# Patient Record
Sex: Female | Born: 1940 | Race: White | Marital: Married | Smoking: Never smoker
Health system: Southern US, Community
[De-identification: ages and names within clinical notes are randomized; demographics above are authoritative.]

## PROBLEM LIST (undated history)

## (undated) DIAGNOSIS — I447 Left bundle-branch block, unspecified: Secondary | ICD-10-CM

## (undated) DIAGNOSIS — I10 Essential (primary) hypertension: Secondary | ICD-10-CM

## (undated) DIAGNOSIS — J329 Chronic sinusitis, unspecified: Secondary | ICD-10-CM

## (undated) DIAGNOSIS — I493 Ventricular premature depolarization: Secondary | ICD-10-CM

## (undated) DIAGNOSIS — R21 Rash and other nonspecific skin eruption: Secondary | ICD-10-CM

## (undated) DIAGNOSIS — E7800 Pure hypercholesterolemia, unspecified: Secondary | ICD-10-CM

## (undated) HISTORY — PX: CHOLECYSTECTOMY: SHX55

---

## 2012-01-30 ENCOUNTER — Encounter (HOSPITAL_COMMUNITY): Payer: Self-pay | Admitting: Emergency Medicine

## 2012-01-30 ENCOUNTER — Other Ambulatory Visit: Payer: Self-pay

## 2012-01-30 ENCOUNTER — Emergency Department (HOSPITAL_COMMUNITY)
Admission: EM | Admit: 2012-01-30 | Discharge: 2012-01-31 | Disposition: A | Discharge status: Home / Self Care | Payer: Medicare Other | Attending: Emergency Medicine | Admitting: Emergency Medicine

## 2012-01-30 ENCOUNTER — Emergency Department (HOSPITAL_COMMUNITY): Payer: Medicare Other

## 2012-01-30 DIAGNOSIS — I491 Atrial premature depolarization: Secondary | ICD-10-CM | POA: Insufficient documentation

## 2012-01-30 DIAGNOSIS — I447 Left bundle-branch block, unspecified: Secondary | ICD-10-CM | POA: Insufficient documentation

## 2012-01-30 DIAGNOSIS — I10 Essential (primary) hypertension: Secondary | ICD-10-CM | POA: Insufficient documentation

## 2012-01-30 DIAGNOSIS — R0789 Other chest pain: Secondary | ICD-10-CM | POA: Insufficient documentation

## 2012-01-30 DIAGNOSIS — Z7982 Long term (current) use of aspirin: Secondary | ICD-10-CM | POA: Insufficient documentation

## 2012-01-30 HISTORY — DX: Chronic sinusitis, unspecified: J32.9

## 2012-01-30 HISTORY — DX: Pure hypercholesterolemia, unspecified: E78.00

## 2012-01-30 HISTORY — DX: Essential (primary) hypertension: I10

## 2012-01-30 HISTORY — DX: Left bundle-branch block, unspecified: I44.7

## 2012-01-30 HISTORY — DX: Rash and other nonspecific skin eruption: R21

## 2012-01-30 HISTORY — DX: Ventricular premature depolarization: I49.3

## 2012-01-30 LAB — DIFFERENTIAL
Basophils Relative: 1 % (ref 0–1)
Eosinophils Absolute: 0.3 10*3/uL (ref 0.0–0.7)
Eosinophils Relative: 3 % (ref 0–5)
Lymphs Abs: 3.4 10*3/uL (ref 0.7–4.0)

## 2012-01-30 LAB — CBC
MCH: 32.7 pg (ref 26.0–34.0)
MCHC: 35.3 g/dL (ref 30.0–36.0)
MCV: 92.8 fL (ref 78.0–100.0)
Platelets: 285 10*3/uL (ref 150–400)
RBC: 4.28 MIL/uL (ref 3.87–5.11)
RDW: 12.2 % (ref 11.5–15.5)

## 2012-01-30 LAB — COMPREHENSIVE METABOLIC PANEL
ALT: 16 U/L (ref 0–35)
Calcium: 10.3 mg/dL (ref 8.4–10.5)
GFR calc Af Amer: >90 mL/min (ref >90)
Glucose, Bld: 100 mg/dL — ABNORMAL HIGH (ref 70–99)
Sodium: 135 mEq/L (ref 135–145)
Total Protein: 8.3 g/dL (ref 6.0–8.3)

## 2012-01-30 LAB — TROPONIN I: Troponin I: <0.3 ng/mL (ref <0.30)

## 2012-01-30 LAB — PROTIME-INR
INR: 1.02 (ref 0.00–1.49)
Prothrombin Time: 13.6 seconds (ref 11.6–15.2)

## 2012-01-30 NOTE — ED Notes (Signed)
C/o intermittent vibration sensation in abd 10 days ago that resolved.  Now having intermittent vibration sensation in L lateral chest since Wednesday.  Denies chest pain and sob.  Felt lightheaded and bilateral leg weakness yesterday.

## 2012-01-30 NOTE — ED Notes (Signed)
PT states 10 days ago she felt a vibration in her lower abdomen, and than a few days ago she felt a vibration on the left side that was around her lower rib cage and radiating under her left breast. Vibration was intermittent with the longest lasting episode being approximately 3 hours

## 2012-01-30 NOTE — ED Provider Notes (Signed)
History     CSN: [id number]  Arrival date & time 01/30/12  1846   First MD Initiated Contact with Patient 01/30/12 2103      Chief Complaint  Patient presents with  . Chest Pain    (Consider location/radiation/quality/duration/timing/severity/associated sxs/prior treatment) HPI Comments: Patient visiting from [place] presenting with "vibrating sensation" in the left lateral chest wall that she's had for the past 5 days. Has been coming and going throughout the day lasting a second or 2 at a time but today has been persistent for 3 hours. It resolved on arrival to the ED. He denies any chest pain, shortness of breath, fever, cough or vomiting. She notes that about 10 days ago she is very similar vibrating sensation in her suprapubic area that lasts a few seconds at a time but is now gone completely. She denies any dizziness, lightheadedness, fever or chills.  She denies any history of CAD, MI. States she does have a history of left bundle branch block, PVCs and mitral prolapse.  States has had negative stress test in past 1 year.  The history is provided by the patient.    Past Medical History  Diagnosis Date  . Bundle branch block left   . Hypertension   . PVC (premature ventricular contraction)   . High cholesterol   . Sinus infection   . Skin rash     Past Surgical History  Procedure Date  . Cholecystectomy     No family history on file.  History  Substance Use Topics  . Smoking status: Never Smoker   . Smokeless tobacco: Not on file  . Alcohol Use: No    OB History    Grav Para Term Preterm Abortions TAB SAB Ect Mult Living                  Review of Systems  Constitutional: Negative for fever, activity change and appetite change.  HENT: Negative for congestion and rhinorrhea.   Respiratory: Negative for cough, chest tightness and shortness of breath.   Cardiovascular: Negative for chest pain.  Gastrointestinal: Negative for nausea, vomiting and abdominal  pain.  Genitourinary: Negative for dysuria, hematuria, vaginal bleeding and vaginal discharge.  Musculoskeletal: Negative for back pain.  Skin: Negative for rash.  Neurological: Negative for dizziness and headaches.    Allergies  Other; Cleocin; Keflex; Ointment base (emulsifying); Penicillins; and Septra  Home Medications   Current Outpatient Rx  Name Route Sig Dispense Refill  . ASPIRIN EC 81 MG PO TBEC Oral Take 81 mg by mouth daily.    . ATORVASTATIN CALCIUM 10 MG PO TABS Oral Take 10 mg by mouth daily.    Marland Kitchen BIOTIN 10 MG PO TABS Oral Take 1 tablet by mouth daily.    Marland Kitchen CALCIUM CARBONATE 1250 MG PO TABS Oral Take 1 tablet by mouth 2 (two) times daily.    Marland Kitchen DIPHENHYDRAMINE HCL 25 MG PO CAPS Oral Take 25 mg by mouth every 6 (six) hours as needed. For rash    . IBUPROFEN 200 MG PO TABS Oral Take 400 mg by mouth every 6 (six) hours as needed. For rash or pain    . MAGNESIUM 500 MG PO CAPS Oral Take 1 capsule by mouth 2 (two) times daily.    Marland Kitchen MUPIROCIN CALCIUM 2 % EX CREA Topical Apply 1 application topically daily as needed. For small injuries    . NADOLOL 80 MG PO TABS Oral Take 80 mg by mouth daily.    Marland Kitchen  OLMESARTAN MEDOXOMIL 40 MG PO TABS Oral Take 40 mg by mouth daily.    . OMEGA-3-ACID ETHYL ESTERS 1 G PO CAPS Oral Take 4 g by mouth daily.    . TRIAMCINOLONE ACETONIDE 0.5 % EX CREA Topical Apply 1 application topically 2 (two) times daily as needed. For rash    . VITAMIN B-12 250 MCG PO TABS Oral Take 125 mcg by mouth daily.      BP 155/74  Pulse 68  Temp(Src) 97.4 F (36.3 C) (Oral)  Resp 14  SpO2 100%  Physical Exam  Constitutional: She is oriented to person, place, and time. She appears well-developed and well-nourished. No distress.  HENT:  Head: Normocephalic and atraumatic.  Mouth/Throat: Oropharynx is clear and moist. No oropharyngeal exudate.  Eyes: Conjunctivae and EOM are normal. Pupils are equal, round, and reactive to light.  Neck: Normal range of motion. Neck  supple.  Cardiovascular: Normal rate, regular rhythm and normal heart sounds.   No murmur heard. Pulmonary/Chest: Effort normal and breath sounds normal. She exhibits no tenderness.       No chest wall tenderness or rash visible  Abdominal: Soft. There is no tenderness. There is no rebound and no guarding.       No pulsatile mass  Musculoskeletal: Normal range of motion. She exhibits no edema and no tenderness.  Neurological: She is alert and oriented to person, place, and time. No cranial nerve deficit.  Skin: Skin is warm.    ED Course  Procedures (including critical care time)  Labs Reviewed  CBC - Abnormal; Notable for the following:    WBC 11.1 (*)    All other components within normal limits  COMPREHENSIVE METABOLIC PANEL - Abnormal; Notable for the following:    Glucose, Bld 100 (*)    GFR calc non Af Amer 86 (*)    All other components within normal limits  D-DIMER, QUANTITATIVE - Abnormal; Notable for the following:    D-Dimer, Quant 0.65 (*)    All other components within normal limits  DIFFERENTIAL  TROPONIN I  PROTIME-INR   Dg Chest 2 View  01/30/2012  *RADIOLOGY REPORT*  Clinical Data: "Vibration sensation" beneath the left breast.  CHEST - 2 VIEW  Comparison: None.  Findings: The lungs are well-aerated.  Mild scarring is noted near the right lung apex.  There is no evidence of focal opacification, pleural effusion or pneumothorax.  The heart is normal in size; the mediastinal contour is within normal limits.  No acute osseous abnormalities are seen.  Clips are noted within the right upper quadrant, reflecting prior cholecystectomy.  IMPRESSION: No acute cardiopulmonary process seen.  Original Report Authenticated By: Tonia Ghent, M.D.   US Aorta  01/30/2012  *RADIOLOGY REPORT*  Clinical Data:  Abnormal physical exam.  ULTRASOUND OF ABDOMINAL AORTA  Technique:  Ultrasound examination of the abdominal aorta was performed to evaluate for abdominal aortic aneurysm.   Comparison: None.  Abdominal Aorta:  Atherosclerotic type changes.  No aneurysm identified.        Maximum AP diameter:  2.5  cm.       Maximum TRV diameter:  1.9 cm.  Right common iliac artery maximal dimension 1.1 cm.  Left common iliac artery maximal dimension 1.0 cm.  Inferior vena cava unremarkable.  Right kidney 9.1 cm.  No hydronephrosis.  Left kidney 9.4 cm.  No hydronephrosis.  IMPRESSION: Atherosclerotic type changes of the aorta without abdominal aortic aneurysm detected.  Original Report Authenticated By: Fuller Canada, M.D.  No diagnosis found.    MDM  Vague vibrating sensation to the left chest has been intermittent for the past 5 days but sustained today for 3 hours. No cardiac history. No reproducible chest pain or rash. EKG nonischemic.  Vibrating sensation in chest is not recurred. Troponin negative. She's d-dimer slightly elevated at 0.6. Reports allergic reaction to contrast dye in the past. She has no tachycardia, hypoxia, increased work of breathing, chest pain or shortness of breath.  Discussed obtaining a VQ scan with the patient but she wishes to go home as she is feeling better.  I believe this is reasonable as she has stable vital signs and no clinical evidence of pulmonary embolism.  She will return with worsening symptoms.   Date: 01/30/2012  Rate: 73  Rhythm: normal sinus rhythm and premature atrial contractions (PAC)  QRS Axis: normal  Intervals: normal  ST/T Wave abnormalities: normal  Conduction Disutrbances:left bundle branch block  Narrative Interpretation:   Old EKG Reviewed: none available         Glynn Octave, MD 01/31/12 0009

## 2012-01-31 NOTE — Discharge Instructions (Signed)
Chest Pain (Nonspecific) It is often hard to give a specific diagnosis for the cause of chest pain. There is always a chance that your pain could be related to something serious, such as a heart attack or a blood clot in the lungs. You need to follow up with your caregiver for further evaluation. CAUSES   Heartburn.   Pneumonia or bronchitis.   Anxiety or stress.   Inflammation around your heart (pericarditis) or lung (pleuritis or pleurisy).   A blood clot in the lung.   A collapsed lung (pneumothorax). It can develop suddenly on its own (spontaneous pneumothorax) or from injury (trauma) to the chest.   Shingles infection (herpes zoster virus).  The chest wall is composed of bones, muscles, and cartilage. Any of these can be the source of the pain.  The bones can be bruised by injury.   The muscles or cartilage can be strained by coughing or overwork.   The cartilage can be affected by inflammation and become sore (costochondritis).  DIAGNOSIS  Lab tests or other studies, such as X-rays, electrocardiography, stress testing, or cardiac imaging, may be needed to find the cause of your pain.  TREATMENT   Treatment depends on what may be causing your chest pain. Treatment may include:   Acid blockers for heartburn.   Anti-inflammatory medicine.   Pain medicine for inflammatory conditions.   Antibiotics if an infection is present.   You may be advised to change lifestyle habits. This includes stopping smoking and avoiding alcohol, caffeine, and chocolate.   You may be advised to keep your head raised (elevated) when sleeping. This reduces the chance of acid going backward from your stomach into your esophagus.   Most of the time, nonspecific chest pain will improve within 2 to 3 days with rest and mild pain medicine.  HOME CARE INSTRUCTIONS   If antibiotics were prescribed, take your antibiotics as directed. Finish them even if you start to feel better.   For the next few  days, avoid physical activities that bring on chest pain. Continue physical activities as directed.   Do not smoke.   Avoid drinking alcohol.   Only take over-the-counter or prescription medicine for pain, discomfort, or fever as directed by your caregiver.   Follow your caregiver's suggestions for further testing if your chest pain does not go away.   Keep any follow-up appointments you made. If you do not go to an appointment, you could develop lasting (chronic) problems with pain. If there is any problem keeping an appointment, you must call to reschedule.  SEEK MEDICAL CARE IF:   You think you are having problems from the medicine you are taking. Read your medicine instructions carefully.   Your chest pain does not go away, even after treatment.   You develop a rash with blisters on your chest.  SEEK IMMEDIATE MEDICAL CARE IF:   You have increased chest pain or pain that spreads to your arm, neck, jaw, back, or abdomen.   You develop shortness of breath, an increasing cough, or you are coughing up blood.   You have severe back or abdominal pain, feel nauseous, or vomit.   You develop severe weakness, fainting, or chills.   You have a fever.  THIS IS AN EMERGENCY. Do not wait to see if the pain will go away. Get medical help at once. Call your local emergency services (911 in [place] Do not drive yourself to the hospital. MAKE SURE YOU:   Understand these instructions.  Will watch your condition.   Will get help right away if you are not doing well or get worse.  Document Released: 07/21/2005 Document Revised: 09/30/2011 Document Reviewed: 05/16/2008 [facility] Patient Information 2012 [place], [place].

## 2012-01-31 NOTE — ED Notes (Signed)
Pt states understanding of need to follow up with her doctor

## 2013-08-25 IMAGING — CR DG CHEST 2V
2 series · 2 of 2 positions shown · non-contrast
Comparison: None.

CLINICAL DATA: "Vibration sensation" beneath the left breast.

CHEST - 2 VIEW

[w chest pa]
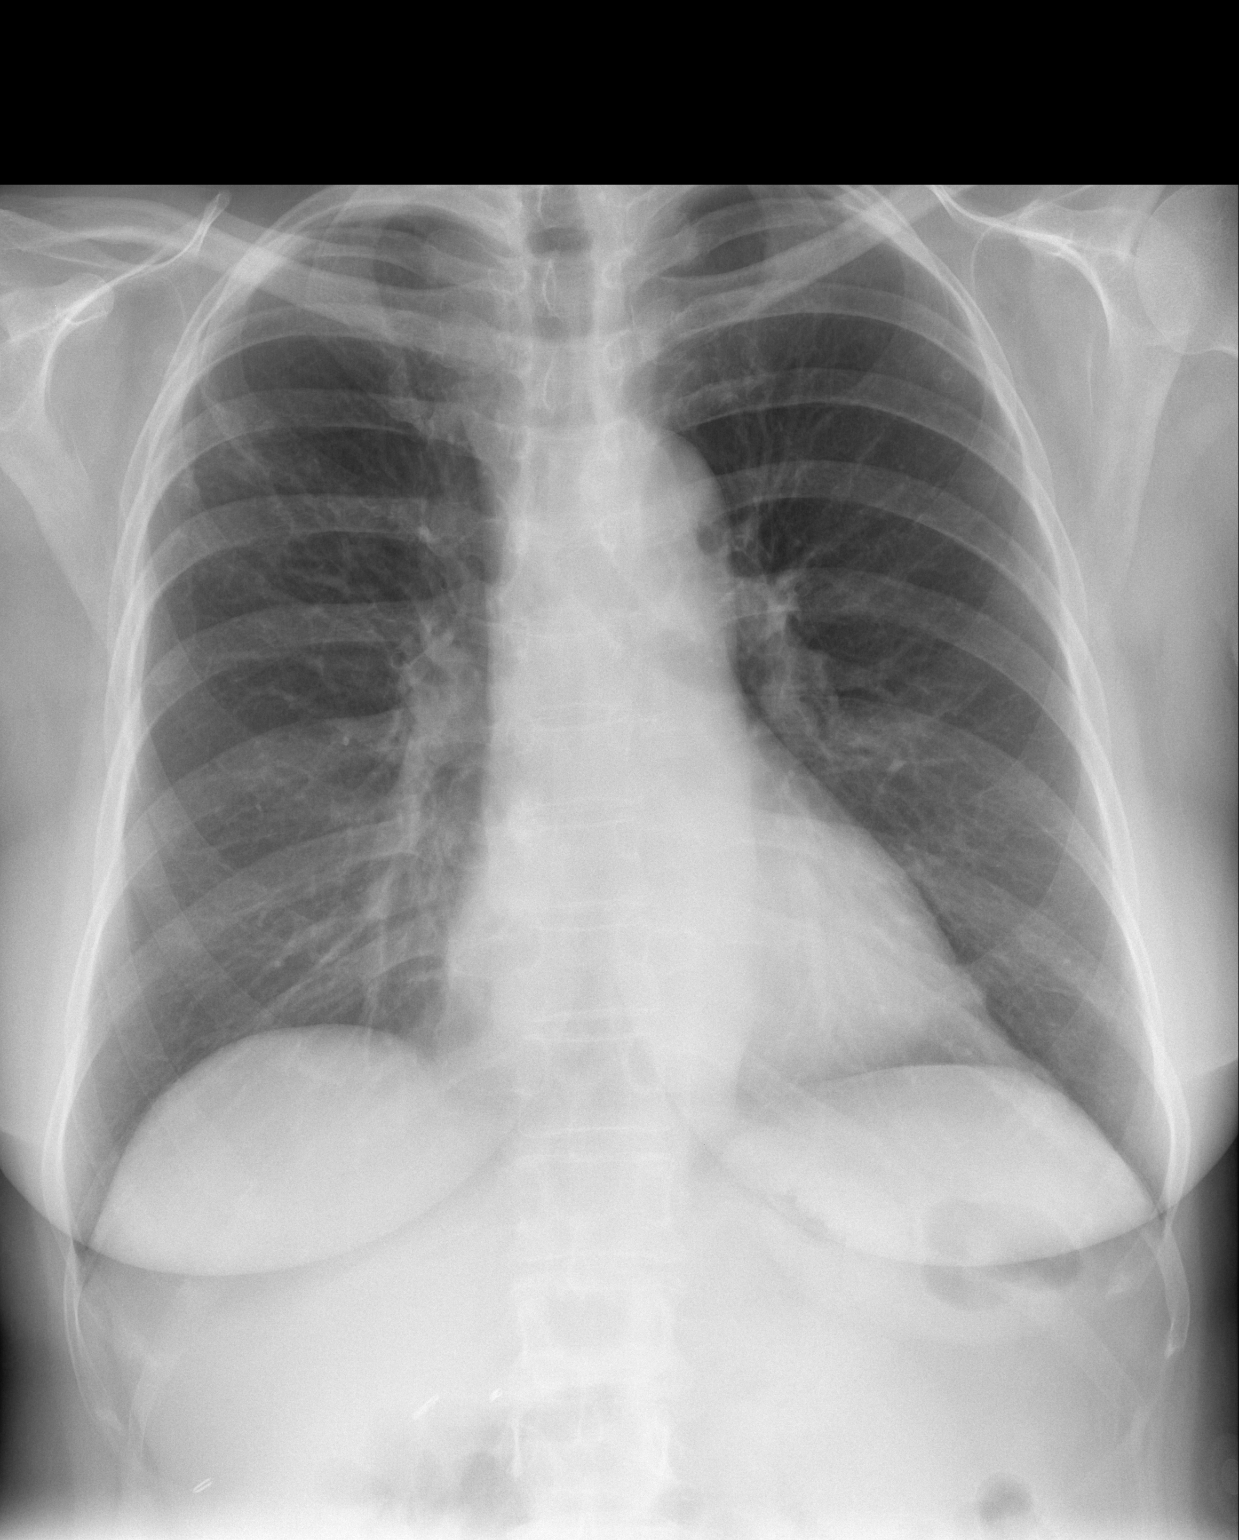

[w chest lat]
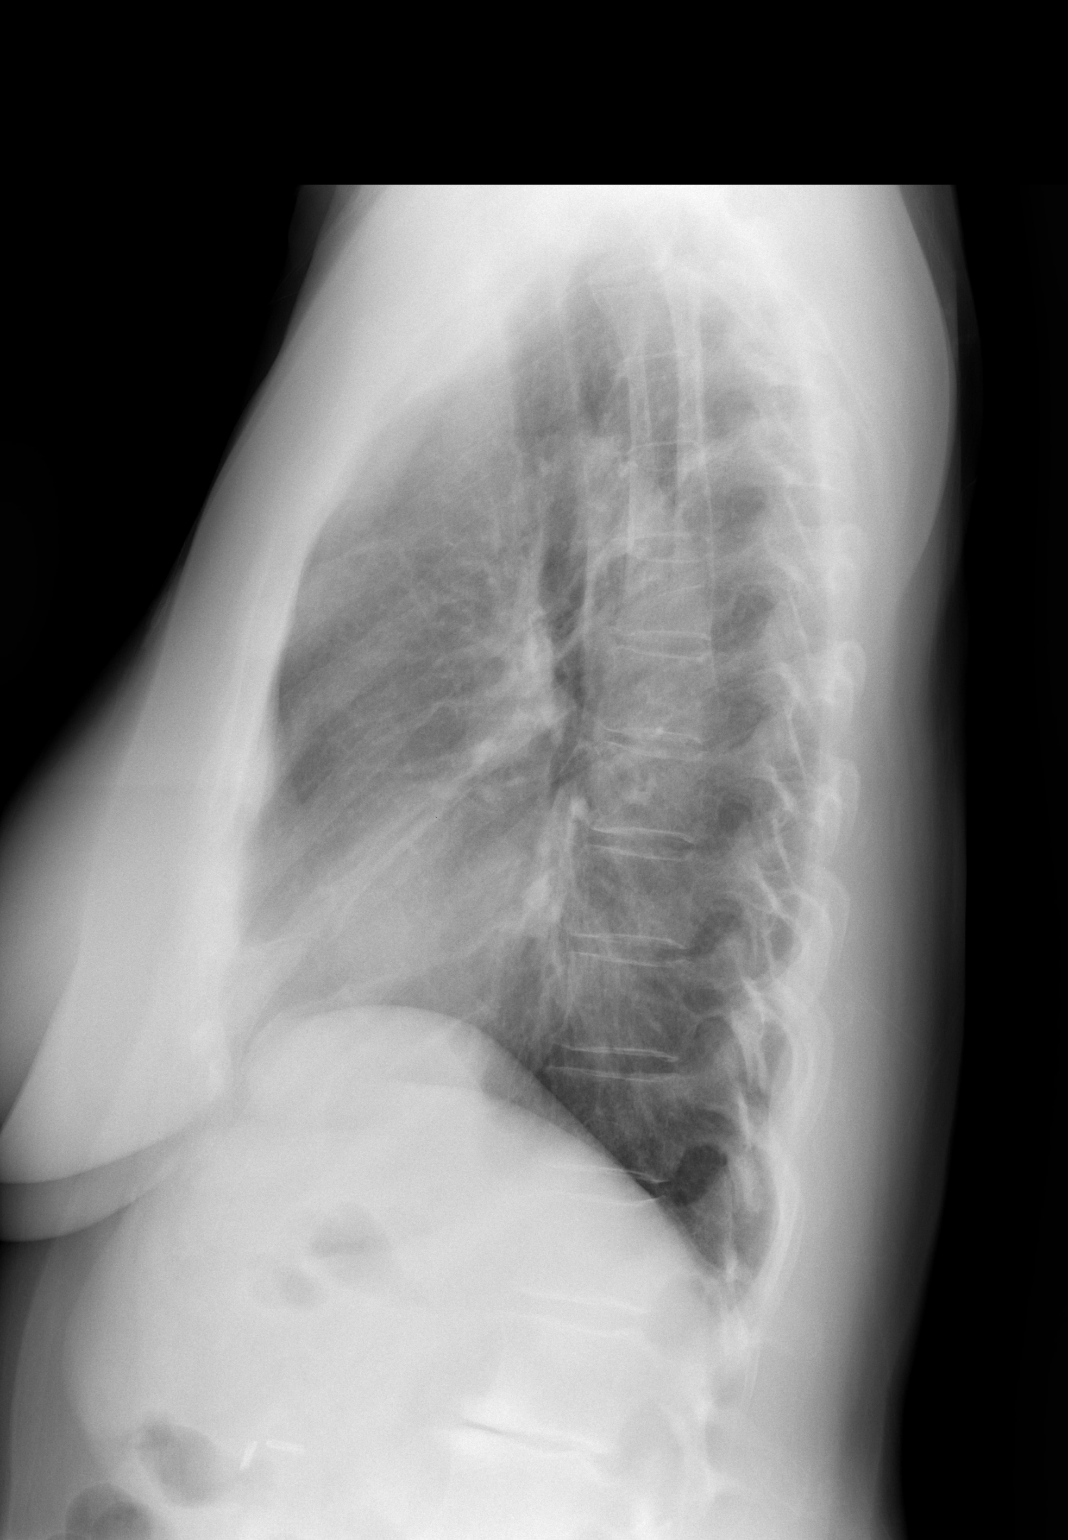

[2 of 2 positions shown; findings below may reference images not displayed]

FINDINGS: The lungs are well-aerated.  Mild scarring is noted near
the right lung apex.  There is no evidence of focal opacification,
pleural effusion or pneumothorax.

The heart is normal in size; the mediastinal contour is within
normal limits.  No acute osseous abnormalities are seen.  Clips are
noted within the right upper quadrant, reflecting prior
cholecystectomy.
IMPRESSION: No acute cardiopulmonary process seen.

## 2018-02-19 ENCOUNTER — Emergency Department (HOSPITAL_COMMUNITY)
Admission: EM | Admit: 2018-02-19 | Discharge: 2018-02-19 | Disposition: A | Discharge status: Home / Self Care | Payer: Medicare Other | Attending: Emergency Medicine | Admitting: Emergency Medicine

## 2018-02-19 ENCOUNTER — Encounter (HOSPITAL_COMMUNITY): Payer: Self-pay | Admitting: Emergency Medicine

## 2018-02-19 DIAGNOSIS — K5900 Constipation, unspecified: Secondary | ICD-10-CM

## 2018-02-19 DIAGNOSIS — Z7982 Long term (current) use of aspirin: Secondary | ICD-10-CM | POA: Diagnosis not present

## 2018-02-19 DIAGNOSIS — Z79899 Other long term (current) drug therapy: Secondary | ICD-10-CM | POA: Diagnosis not present

## 2018-02-19 LAB — CBC
HEMATOCRIT: 43.5 % (ref 36.0–46.0)
HEMOGLOBIN: 15.1 g/dL — AB (ref 12.0–15.0)
MCH: 33.3 pg (ref 26.0–34.0)
MCHC: 34.7 g/dL (ref 30.0–36.0)
MCV: 95.8 fL (ref 78.0–100.0)
Platelets: 261 10*3/uL (ref 150–400)
RBC: 4.54 MIL/uL (ref 3.87–5.11)
RDW: 12.6 % (ref 11.5–15.5)
WBC: 14.7 10*3/uL — ABNORMAL HIGH (ref 4.0–10.5)

## 2018-02-19 LAB — COMPREHENSIVE METABOLIC PANEL
ALBUMIN: 3.9 g/dL (ref 3.5–5.0)
ALT: 19 U/L (ref 14–54)
ANION GAP: 15 (ref 5–15)
AST: 23 U/L (ref 15–41)
Alkaline Phosphatase: 102 U/L (ref 38–126)
BUN: 15 mg/dL (ref 6–20)
CO2: 25 mmol/L (ref 22–32)
Calcium: 9.3 mg/dL (ref 8.9–10.3)
Chloride: 96 mmol/L — ABNORMAL LOW (ref 101–111)
Creatinine, Ser: 0.68 mg/dL (ref 0.44–1.00)
GFR calc non Af Amer: >60 mL/min (ref >60)
GLUCOSE: 126 mg/dL — AB (ref 65–99)
POTASSIUM: 4.5 mmol/L (ref 3.5–5.1)
SODIUM: 136 mmol/L (ref 135–145)
TOTAL PROTEIN: 8.3 g/dL — AB (ref 6.5–8.1)
Total Bilirubin: 1.7 mg/dL — ABNORMAL HIGH (ref 0.3–1.2)

## 2018-02-19 LAB — LIPASE, BLOOD: Lipase: 27 U/L (ref 11–51)

## 2018-02-19 MED ORDER — MAGNESIUM HYDROXIDE 400 MG/5ML PO SUSP
960.0000 mL | Freq: Once | ORAL | Status: AC
  Administered 2018-02-19: 960 mL via RECTAL
  Filled 2018-02-19: qty 473

## 2018-02-19 NOTE — ED Notes (Signed)
ED Provider at bedside. 

## 2018-02-19 NOTE — Discharge Instructions (Addendum)
Stay on a high-fiber diet, and drink plenty of water daily.  To treat constipation use MiraLAX twice a day, for 1 week or until having regular soft stools.  After that use MiraLAX once a day for 2 weeks.  Consider using Colace (docusate sodium), for another few weeks after stopping the MiraLAX.  Some people need to stay on Colace, all the time to remain having normal bowel movements.  Return here, if needed, for problems.

## 2018-02-19 NOTE — ED Provider Notes (Signed)
[facility] EMERGENCY DEPARTMENT Provider Note   CSN: [id number] Arrival date & time: 02/19/18  9562     History   Chief Complaint Chief Complaint  Patient presents with  . Constipation    HPI Alison Barrett is a 77 y.o. female.  Patient here for evaluation of decreased stooling, sensation of fullness, early satiety, and urge to defecate.  Last bowel movement was 5 days ago.  She is traveling visiting family here in [place].  No fever, chills, nausea, vomiting, cough, shortness of breath, chest pain, weakness or dizziness.  No chronic stooling disorder.  There are no other known modifying factors.    HPI  Past Medical History:  Diagnosis Date  . Bundle branch block left   . High cholesterol   . Hypertension   . PVC (premature ventricular contraction)   . Sinus infection   . Skin rash     There are no active problems to display for this patient.   Past Surgical History:  Procedure Laterality Date  . CHOLECYSTECTOMY       OB History   None      Home Medications    Prior to Admission medications   Medication Sig Start Date End Date Taking? Authorizing Provider  aspirin EC 81 MG tablet Take 81 mg by mouth daily.    [provider]  atorvastatin (LIPITOR) 10 MG tablet Take 10 mg by mouth daily.    [provider]  Biotin 10 MG TABS Take 1 tablet by mouth daily.    [provider]  calcium carbonate (OS-CAL - DOSED IN MG OF ELEMENTAL CALCIUM) 1250 MG tablet Take 1 tablet by mouth 2 (two) times daily.    [provider]  diphenhydrAMINE (BENADRYL) 25 mg capsule Take 25 mg by mouth every 6 (six) hours as needed. For rash    [provider]  ibuprofen (ADVIL,MOTRIN) 200 MG tablet Take 400 mg by mouth every 6 (six) hours as needed. For rash or pain    [provider]  Magnesium 500 MG CAPS Take 1 capsule by mouth 2 (two) times daily.    [provider]  mupirocin cream (BACTROBAN) 2  % Apply 1 application topically daily as needed. For small injuries    [provider]  nadolol (CORGARD) 80 MG tablet Take 80 mg by mouth daily.    [provider]  olmesartan (BENICAR) 40 MG tablet Take 40 mg by mouth daily.    [provider]  omega-3 acid ethyl esters (LOVAZA) 1 G capsule Take 4 g by mouth daily.    [provider]  triamcinolone cream (KENALOG) 0.5 % Apply 1 application topically 2 (two) times daily as needed. For rash    [provider]  vitamin B-12 (CYANOCOBALAMIN) 250 MCG tablet Take 125 mcg by mouth daily.    [provider]    Family History History reviewed. No pertinent family history.  Social History Social History   Tobacco Use  . Smoking status: Never Smoker  . Smokeless tobacco: Never Used  Substance Use Topics  . Alcohol use: No  . Drug use: No     Allergies   Other; Cephalexin; Clindamycin hcl; Emulsifying ointment; Penicillins; and Septra [bactrim]   Review of Systems Review of Systems  All other systems reviewed and are negative.    Physical Exam Updated Vital Signs BP 116/74   Pulse (!) 58   Temp 98.4 F (36.9 C) (Oral)   Resp 14  SpO2 96%   Physical Exam  Constitutional: She is oriented to person, place, and time. She appears well-developed and well-nourished.  HENT:  Head: Normocephalic and atraumatic.  Right Ear: External ear normal.  Left Ear: External ear normal.  Eyes: Pupils are equal, round, and reactive to light. Conjunctivae and EOM are normal.  Neck: Normal range of motion and phonation normal. Neck supple.  Cardiovascular: Normal rate, regular rhythm and normal heart sounds.  Pulmonary/Chest: Effort normal and breath sounds normal. She exhibits no bony tenderness.  Abdominal: Soft. She exhibits no distension. There is no tenderness. There is no guarding.  Genitourinary:  Genitourinary Comments: Normal anus.  No stool in rectal vault.  Musculoskeletal: Normal  range of motion.  Neurological: She is alert and oriented to person, place, and time. No cranial nerve deficit or sensory deficit. She exhibits normal muscle tone. Coordination normal.  Skin: Skin is warm, dry and intact.  Psychiatric: She has a normal mood and affect. Her behavior is normal. Judgment and thought content normal.  Nursing note and vitals reviewed.    ED Treatments / Results  Labs (all labs ordered are listed, but only abnormal results are displayed) Labs Reviewed  COMPREHENSIVE METABOLIC PANEL - Abnormal; Notable for the following components:      Result Value   Chloride 96 (*)    Glucose, Bld 126 (*)    Total Protein 8.3 (*)    Total Bilirubin 1.7 (*)    All other components within normal limits  CBC - Abnormal; Notable for the following components:   WBC 14.7 (*)    Hemoglobin 15.1 (*)    All other components within normal limits  LIPASE, BLOOD    EKG None  Radiology No results found.  Procedures Procedures (including critical care time)  Medications Ordered in ED Medications  sorbitol, milk of mag, mineral oil, glycerin (SMOG) enema (960 mLs Rectal Given 02/19/18 1405)     Initial Impression / Assessment and Plan / ED Course  I have reviewed the triage vital signs and the nursing notes.  Pertinent labs & imaging results that were available during my care of the patient were reviewed by me and considered in my medical decision making (see chart for details).  Clinical Course as of Feb 19 1554  Sun Feb 19, 2018  1314 Elevated  BP(!): 212/76 [EW]  1314 Normal except chloride low at 96 and glucose at 126 and total bilirubin high 1.7  Comprehensive metabolic panel(!) [EW]  1315 Normal  Lipase, blood [EW]  1315 Normal except elevated white blood cell count 14.7, and elevated hemoglobin 15.1.   [EW]    Clinical Course User Index [EW] Mancel Bale, MD     Patient Vitals for the past 24 hrs:  BP Temp Temp src Pulse Resp SpO2  02/19/18 1530  116/74 - - (!) 58 - 96 %  02/19/18 1515 119/72 - - 61 - 97 %  02/19/18 1500 128/68 - - (!) 56 - 100 %  02/19/18 1457 133/61 - - 61 - 97 %  02/19/18 1003 (!) 212/76 98.4 F (36.9 C) Oral 65 14 98 %    3:48 PM Reevaluation with update and discussion. After initial assessment and treatment, an updated evaluation reveals patient is doing well after enema.  Findings discussed with patient all questions answered. Mancel Bale   Consistent with constipation, evidence for serious intra-abdominal abnormalities.  Blood pressure normal at this time.  Etiology of constipation is likely multifactorial, without evidence for serious bacterial  infection or metabolic instability.  Patient improved and stable for discharge.   Nursing Notes Reviewed/ Care Coordinated Applicable Imaging Reviewed Interpretation of Laboratory Data incorporated into ED treatment  The patient appears reasonably screened and/or stabilized for discharge and I doubt any other medical condition or other [facility] requiring further screening, evaluation, or treatment in the ED at this time prior to discharge.  Plan: Home Medications-continue usual medications, use MiraLAX twice a day for 1 week then once a day for 2 weeks.; Home Treatments-high-fiber diet plenty fluids; return here if the recommended treatment, does not improve the symptoms; Recommended follow up-PCP follow-up as needed    Final Clinical Impressions(s) / ED Diagnoses   Final diagnoses:  Constipation, unspecified constipation type    ED Discharge Orders    None       Mancel Bale, MD 02/19/18 1555

## 2018-02-19 NOTE — ED Triage Notes (Signed)
Pt to ER for evaluation of constipation, states last BM Wednesday. Pt states has attempted stool softeners and laxatives without any relief. States has poor appetite. Denies abdominal pain.

## 2018-02-19 NOTE — ED Notes (Signed)
Pt ambulated to bathroom 

## 2022-09-03 ENCOUNTER — Ambulatory Visit
Admission: EM | Admit: 2022-09-03 | Discharge: 2022-09-03 | Disposition: A | Discharge status: Home / Self Care | Payer: Medicare Other | Attending: Nurse Practitioner | Admitting: Nurse Practitioner

## 2022-09-03 ENCOUNTER — Encounter (HOSPITAL_COMMUNITY): Payer: Self-pay

## 2022-09-03 ENCOUNTER — Other Ambulatory Visit: Payer: Self-pay

## 2022-09-03 ENCOUNTER — Emergency Department (HOSPITAL_COMMUNITY)
Admission: EM | Admit: 2022-09-03 | Discharge: 2022-09-03 | Disposition: A | Discharge status: Home / Self Care | Payer: Medicare Other | Attending: Emergency Medicine | Admitting: Emergency Medicine

## 2022-09-03 ENCOUNTER — Emergency Department (HOSPITAL_COMMUNITY): Payer: Medicare Other

## 2022-09-03 DIAGNOSIS — I10 Essential (primary) hypertension: Secondary | ICD-10-CM | POA: Diagnosis not present

## 2022-09-03 DIAGNOSIS — R630 Anorexia: Secondary | ICD-10-CM | POA: Diagnosis not present

## 2022-09-03 DIAGNOSIS — N390 Urinary tract infection, site not specified: Secondary | ICD-10-CM | POA: Insufficient documentation

## 2022-09-03 DIAGNOSIS — R42 Dizziness and giddiness: Secondary | ICD-10-CM | POA: Diagnosis not present

## 2022-09-03 DIAGNOSIS — R531 Weakness: Secondary | ICD-10-CM

## 2022-09-03 DIAGNOSIS — R110 Nausea: Secondary | ICD-10-CM | POA: Diagnosis not present

## 2022-09-03 DIAGNOSIS — Z7982 Long term (current) use of aspirin: Secondary | ICD-10-CM | POA: Diagnosis not present

## 2022-09-03 LAB — CBC
HCT: 41.2 % (ref 36.0–46.0)
Hemoglobin: 13.8 g/dL (ref 12.0–15.0)
MCH: 32.4 pg (ref 26.0–34.0)
MCHC: 33.5 g/dL (ref 30.0–36.0)
MCV: 96.7 fL (ref 80.0–100.0)
Platelets: 182 10*3/uL (ref 150–400)
RBC: 4.26 MIL/uL (ref 3.87–5.11)
RDW: 12.7 % (ref 11.5–15.5)
WBC: 10.8 10*3/uL — ABNORMAL HIGH (ref 4.0–10.5)
nRBC: 0 % (ref 0.0–0.2)

## 2022-09-03 LAB — BASIC METABOLIC PANEL
Anion gap: 10 (ref 5–15)
BUN: 17 mg/dL (ref 8–23)
CO2: 26 mmol/L (ref 22–32)
Calcium: 8.8 mg/dL — ABNORMAL LOW (ref 8.9–10.3)
Chloride: 100 mmol/L (ref 98–111)
Creatinine, Ser: 0.7 mg/dL (ref 0.44–1.00)
GFR, Estimated: >60 mL/min (ref >60)
Glucose, Bld: 213 mg/dL — ABNORMAL HIGH (ref 70–99)
Potassium: 3 mmol/L — ABNORMAL LOW (ref 3.5–5.1)
Sodium: 136 mmol/L (ref 135–145)

## 2022-09-03 LAB — URINALYSIS, ROUTINE W REFLEX MICROSCOPIC
Bilirubin Urine: NEGATIVE
Glucose, UA: NEGATIVE mg/dL
Ketones, ur: NEGATIVE mg/dL
Nitrite: NEGATIVE
Protein, ur: NEGATIVE mg/dL
Specific Gravity, Urine: 1.002 — ABNORMAL LOW (ref 1.005–1.030)
pH: 6 (ref 5.0–8.0)

## 2022-09-03 MED ORDER — POTASSIUM CHLORIDE CRYS ER 20 MEQ PO TBCR
40.0000 meq | EXTENDED_RELEASE_TABLET | Freq: Once | ORAL | Status: AC
  Administered 2022-09-03: 40 meq via ORAL
  Filled 2022-09-03: qty 2

## 2022-09-03 MED ORDER — FOSFOMYCIN TROMETHAMINE 3 G PO PACK
3.0000 g | PACK | Freq: Once | ORAL | Status: AC
  Administered 2022-09-03: 3 g via ORAL
  Filled 2022-09-03: qty 3

## 2022-09-03 NOTE — ED Triage Notes (Signed)
Pt reports feeling "off balance" x 2 days.  Reports feeling dizzy.  Pt says hasn't had an appetite for the past few days either.  Sister says pt was very lethargic yesterday, fell asleep at the table.  Reported feeling nauseated this morning and was incontinent with diarrhea x 2 this am.  Denies any pain.

## 2022-09-03 NOTE — ED Triage Notes (Signed)
Pt states she is not feeling herself. States she has no appetite and is sleepy. Also states she feels off balance and nauseous.

## 2022-09-03 NOTE — ED Provider Notes (Signed)
[facility] EMERGENCY DEPARTMENT Provider Note  CSN: [id number] Arrival date & time: 09/03/22 1039  Chief Complaint(s) Dizziness  HPI Alison Barrett is a 81 y.o. female with history of hypertension, hyperlipidemia, chronic sinusitis presenting to the emergency department with fatigue.  She reports feeling fatigued, lightheadedness, anorexia, generalized weakness for the past 2 days.  She reports occasional dizzy sensation.  She has been able to ambulate.  She denies any abdominal pain, dysuria, cough, fevers, chills, chest pain, headaches, visual changes.  Symptoms are mild.   Past Medical History Past Medical History:  Diagnosis Date   Bundle branch block left    High cholesterol    Hypertension    PVC (premature ventricular contraction)    Sinus infection    Skin rash    There are no problems to display for this patient.  Home Medication(s) Prior to Admission medications   Medication Sig Start Date End Date Taking? Authorizing Provider  aspirin EC 81 MG tablet Take 81 mg by mouth daily.    [provider]  atorvastatin (LIPITOR) 10 MG tablet Take 10 mg by mouth daily.    [provider]  Biotin 10 MG TABS Take 1 tablet by mouth daily.    [provider]  calcium carbonate (OS-CAL - DOSED IN MG OF ELEMENTAL CALCIUM) 1250 MG tablet Take 1 tablet by mouth 2 (two) times daily.    [provider]  diphenhydrAMINE (BENADRYL) 25 mg capsule Take 25 mg by mouth every 6 (six) hours as needed. For rash    [provider]  ibuprofen (ADVIL,MOTRIN) 200 MG tablet Take 400 mg by mouth every 6 (six) hours as needed. For rash or pain    [provider]  Magnesium 500 MG CAPS Take 1 capsule by mouth 2 (two) times daily.    [provider]  mupirocin cream (BACTROBAN) 2 % Apply 1 application topically daily as needed. For small injuries    [provider]  nadolol (CORGARD) 80 MG tablet Take 80 mg by mouth daily.    [provider]  olmesartan (BENICAR) 40 MG tablet Take 40 mg by mouth daily.    [provider]  omega-3 acid ethyl esters (LOVAZA) 1 G capsule Take 4 g by mouth daily.    [provider]  triamcinolone cream (KENALOG) 0.5 % Apply 1 application topically 2 (two) times daily as needed. For rash    [provider]  vitamin B-12 (CYANOCOBALAMIN) 250 MCG tablet Take 125 mcg by mouth daily.    [provider]                                                                                                                                    Past Surgical History Past Surgical History:  Procedure Laterality Date   CHOLECYSTECTOMY     Family History No family history on file.  Social History Social History  Tobacco Use   Smoking status: Never   Smokeless tobacco: Never  Substance Use Topics   Alcohol use: No   Drug use: No   Allergies Other, Cleocin [clindamycin], Lorabid [loracarbef], Cephalexin, Clindamycin hcl, Emulsifying ointment, Penicillins, and Septra [bactrim]  Review of Systems Review of Systems  All other systems reviewed and are negative.   Physical Exam Vital Signs  I have reviewed the triage vital signs BP (!) 146/57   Pulse 65   Temp 98 F (36.7 C) (Oral)   Resp 17   Ht 5\' 4"  (1.626 m)   Wt 65.8 kg   SpO2 95%   BMI 24.89 kg/m  Physical Exam Vitals and nursing note reviewed.  Constitutional:      General: She is not in acute distress.    Appearance: She is well-developed.  HENT:     Head: Normocephalic and atraumatic.     Mouth/Throat:     Mouth: Mucous membranes are moist.  Eyes:     Pupils: Pupils are equal, round, and reactive to light.  Cardiovascular:     Rate and Rhythm: Normal rate and regular rhythm.     Heart sounds: No murmur heard. Pulmonary:     Effort: Pulmonary effort is normal. No respiratory distress.     Breath sounds: Normal breath sounds.  Abdominal:     General: Abdomen is flat.      Palpations: Abdomen is soft.     Tenderness: There is no abdominal tenderness.  Musculoskeletal:        General: No tenderness.     Right lower leg: No edema.     Left lower leg: No edema.  Skin:    General: Skin is warm and dry.  Neurological:     General: No focal deficit present.     Mental Status: She is alert. Mental status is at baseline.     Comments: Cranial nerves II through XII intact, strength 5 out of 5 in the bilateral upper and lower extremities, no sensory deficit to light touch, no dysmetria on finger-nose-finger testing, ambulatory with steady gait.  Psychiatric:        Mood and Affect: Mood normal.        Behavior: Behavior normal.     ED Results and Treatments Labs (all labs ordered are listed, but only abnormal results are displayed) Labs Reviewed  BASIC METABOLIC PANEL - Abnormal; Notable for the following components:      Result Value   Potassium 3.0 (*)    Glucose, Bld 213 (*)    Calcium 8.8 (*)    All other components within normal limits  CBC - Abnormal; Notable for the following components:   WBC 10.8 (*)    All other components within normal limits  URINALYSIS, ROUTINE W REFLEX MICROSCOPIC - Abnormal; Notable for the following components:   Color, Urine COLORLESS (*)    Specific Gravity, Urine 1.002 (*)    Hgb urine dipstick SMALL (*)    Leukocytes,Ua LARGE (*)    Bacteria, UA FEW (*)    All other components within normal limits  Radiology CT Head Wo Contrast  Result Date: 09/03/2022 CLINICAL DATA:  Headache. Patient reports feeling off balance and dizzy. EXAM: CT HEAD WITHOUT CONTRAST TECHNIQUE: Contiguous axial images were obtained from the base of the skull through the vertex without intravenous contrast. RADIATION DOSE REDUCTION: This exam was performed according to the departmental dose-optimization program which includes  automated exposure control, adjustment of the mA and/or kV according to patient size and/or use of iterative reconstruction technique. COMPARISON:  None Available. FINDINGS: Brain: No evidence of acute infarction, hemorrhage, hydrocephalus, extra-axial collection or mass lesion/mass effect. There is sequela of mild chronic microvascular ischemic change. Vascular: No hyperdense vessel or unexpected calcification. Skull: Normal. Negative for fracture or focal lesion. Sinuses/Orbits: Extensive paranasal sinus disease with complete opacification of the bilateral maxillary sinuses, the left sphenoid sinus, bilateral frontal sinuses, and bilateral anterior ethmoid air cells. There are osseous findings suggestive of chronic sinusitis. No evidence of mastoid effusion. Status post left lens replacement. Other: None. IMPRESSION: 1. No acute intracranial abnormality. 2. Sequela of mild chronic microvascular ischemic change. 3. Extensive paranasal sinus disease with near-complete opacification of nearly all paranasal sinuses and osseous findings suggestive of chronic sinusitis. Electronically Signed   By: Marin Roberts M.D.   On: 09/03/2022 15:49    Pertinent labs & imaging results that were available during my care of the patient were reviewed by me and considered in my medical decision making (see MDM for details).  Medications Ordered in ED Medications  fosfomycin (MONUROL) packet 3 g (3 g Oral Given 09/03/22 1610)  potassium chloride SA (KLOR-CON M) CR tablet 40 mEq (40 mEq Oral Given 09/03/22 1610)                                                                                                                                     Procedures .1-3 Lead EKG Interpretation  Performed by: Cristie Hem, MD Authorized by: Cristie Hem, MD     Interpretation: normal     ECG rate:  70   ECG rate assessment: normal     Rhythm: sinus rhythm     Ectopy: none     Conduction: normal     (including  critical care time)  Medical Decision Making / ED Course   MDM:  81 year old female presenting to the emergency department generalized weakness.  Examination overall reassuring.  Neurologic exam is normal.  Suspect likely urinary infection given results.  Her urinalysis is concerning for leukocytes and bacteria.  Will treat with fosfomycin as she has multiple medication allergies.  Doubt occult intra-abdominal infection with no nausea, vomiting, diarrhea, no abdominal tenderness.  Doubt pneumonia with clear lungs, no cough.  Patient has chronic sinusitis, CT scan without evidence of acute sinusitis, lower concern that this represents her symptoms.  Laboratory testing reassuring including reassuring hemoglobin.  Patient mildly hypokalemic but this would not explain her symptoms.  Patient is hyperglycemic, denies history of  diabetes, advise recheck with primary physician. No glucose in urine.       Additional history obtained: -Additional history obtained from family -External records from outside source obtained and reviewed including: Chart review including previous notes, labs, imaging, consultation notes including outpatient visit 06/17/22   Lab Tests: -I ordered, reviewed, and interpreted labs.   The pertinent results include:   Labs Reviewed  BASIC METABOLIC PANEL - Abnormal; Notable for the following components:      Result Value   Potassium 3.0 (*)    Glucose, Bld 213 (*)    Calcium 8.8 (*)    All other components within normal limits  CBC - Abnormal; Notable for the following components:   WBC 10.8 (*)    All other components within normal limits  URINALYSIS, ROUTINE W REFLEX MICROSCOPIC - Abnormal; Notable for the following components:   Color, Urine COLORLESS (*)    Specific Gravity, Urine 1.002 (*)    Hgb urine dipstick SMALL (*)    Leukocytes,Ua LARGE (*)    Bacteria, UA FEW (*)    All other components within normal limits    Notable for hypokalemia, signs of UTI,  mild leukocytosis  EKG   EKG Interpretation  Date/Time:  Friday September 03 2022 11:43:26 EST Ventricular Rate:  71 PR Interval:  172 QRS Duration: 144 QT Interval:  442 QTC Calculation: 480 R Axis:   -47 Text Interpretation: Normal sinus rhythm Left axis deviation Left bundle branch block Abnormal ECG When compared with ECG of 03-Sep-2022 09:08, No significant change was found Confirmed by Garnette Gunner [phone]) on 09/03/2022 3:06:02 PM         Imaging Studies ordered: I ordered imaging studies including CT head On my interpretation imaging demonstrates chronic sinusitis I independently visualized and interpreted imaging. I agree with the radiologist interpretation   Medicines ordered and prescription drug management: Meds ordered this encounter  Medications   fosfomycin (MONUROL) packet 3 g   potassium chloride SA (KLOR-CON M) CR tablet 40 mEq    -I have reviewed the patients home medicines and have made adjustments as needed  Cardiac Monitoring: The patient was maintained on a cardiac monitor.  I personally viewed and interpreted the cardiac monitored which showed an underlying rhythm of: NSR  Social Determinants of Health:  Diagnosis or treatment significantly limited by social determinants of health: traveling, doesn't live here   Reevaluation: After the interventions noted above, I reevaluated the patient and found that they have improved  Co morbidities that complicate the patient evaluation  Past Medical History:  Diagnosis Date   Bundle branch block left    High cholesterol    Hypertension    PVC (premature ventricular contraction)    Sinus infection    Skin rash       Dispostion: Disposition decision including need for hospitalization was considered, and patient discharged from emergency department.    Final Clinical Impression(s) / ED Diagnoses Final diagnoses:  Urinary tract infection without hematuria, site unspecified  Generalized  weakness     This chart was dictated using voice recognition software.  Despite best efforts to proofread,  errors can occur which can change the documentation meaning.    Cristie Hem, MD 09/03/22 [phone]

## 2022-09-03 NOTE — Discharge Instructions (Addendum)
Go to the emergency department for further evaluation

## 2022-09-03 NOTE — Discharge Instructions (Addendum)
We evaluated you for your symptoms.  Your lab test showed that you have a mild urinary infection.  We did not see signs of any strokes, bleeding in your brain, or other problems which can sometimes cause weakness.  We have given you a medication called fosfomycin, which we can give you in the emergency department for a single dose.  Hopefully, this should improve your symptoms.  If you develop any worsening symptoms, fevers or chills, flank or back pain, vomiting, headaches, or any other worsening symptoms.

## 2022-09-03 NOTE — ED Notes (Signed)
Patient is being discharged from the Urgent Care and sent to the Emergency Department via private vehicle . Per Devra Dopp, patient is in need of higher level of care due to lack of appetite. Patient is aware and verbalizes understanding of plan of care.  Vitals:   09/03/22 0829  BP: (!) 158/77  Pulse: 69  Resp: 16  Temp: 98.1 F (36.7 C)  SpO2: 96%

## 2022-09-03 NOTE — ED Provider Notes (Signed)
[facility]    CSN: [id number] Arrival date & time: 09/03/22  2542      History   Chief Complaint Chief Complaint  Patient presents with   generalized weakness    HPI Alison Barrett is a 81 y.o. female.   The history is provided by the patient.   Patient presents for complaints of "not feeling like herself, decreased appetite, weakness, increased fatigue, feeling off balance, and nausea.  She states that she is visiting from [place], and recently drove for 10 to 15 hours to [place].  She states during her trip, she did change time zones, and also had to adjust to daylight savings time.  She states that she has not eaten in the past 2 days.  She states this morning she did have 2 episodes of diarrhea.  She denies fever, chills, chest pain, shortness of breath, difficulty breathing, blurred vision or headache.  She is accompanied by her friend who states that she has not been "talking as much".  He reports that she does have a history of high cholesterol, and hypertension.  Past Medical History:  Diagnosis Date   Bundle branch block left    High cholesterol    Hypertension    PVC (premature ventricular contraction)    Sinus infection    Skin rash     There are no problems to display for this patient.   Past Surgical History:  Procedure Laterality Date   CHOLECYSTECTOMY      OB History   No obstetric history on file.      Home Medications    Prior to Admission medications   Medication Sig Start Date End Date Taking? Authorizing Provider  aspirin EC 81 MG tablet Take 81 mg by mouth daily.    [provider]  atorvastatin (LIPITOR) 10 MG tablet Take 10 mg by mouth daily.    [provider]  Biotin 10 MG TABS Take 1 tablet by mouth daily.    [provider]  calcium carbonate (OS-CAL - DOSED IN MG OF ELEMENTAL CALCIUM) 1250 MG tablet Take 1 tablet by mouth 2 (two) times daily.    [provider]   diphenhydrAMINE (BENADRYL) 25 mg capsule Take 25 mg by mouth every 6 (six) hours as needed. For rash    [provider]  ibuprofen (ADVIL,MOTRIN) 200 MG tablet Take 400 mg by mouth every 6 (six) hours as needed. For rash or pain    [provider]  Magnesium 500 MG CAPS Take 1 capsule by mouth 2 (two) times daily.    [provider]  mupirocin cream (BACTROBAN) 2 % Apply 1 application topically daily as needed. For small injuries    [provider]  nadolol (CORGARD) 80 MG tablet Take 80 mg by mouth daily.    [provider]  olmesartan (BENICAR) 40 MG tablet Take 40 mg by mouth daily.    [provider]  omega-3 acid ethyl esters (LOVAZA) 1 G capsule Take 4 g by mouth daily.    [provider]  triamcinolone cream (KENALOG) 0.5 % Apply 1 application topically 2 (two) times daily as needed. For rash    [provider]  vitamin B-12 (CYANOCOBALAMIN) 250 MCG tablet Take 125 mcg by mouth daily.    [provider]    Family History History reviewed. No pertinent family history.  Social History Social History   Tobacco Use   Smoking status: Never   Smokeless tobacco: Never  Substance  Use Topics   Alcohol use: No   Drug use: No     Allergies   Other, Cephalexin, Clindamycin hcl, Emulsifying ointment, Penicillins, and Septra [bactrim]   Review of Systems Review of Systems Per HPI  Physical Exam Triage Vital Signs ED Triage Vitals [09/03/22 0829]  Enc Vitals Group     BP (!) 158/77     Pulse Rate 69     Resp 16     Temp 98.1 F (36.7 C)     Temp Source Oral     SpO2 96 %     Weight 145 lb (65.8 kg)     Height 5\' 4"  (1.626 m)     Head Circumference      Peak Flow      Pain Score 0     Pain Loc      Pain Edu?      Excl. in GC?    No data found.  Updated Vital Signs BP (!) 158/77 (BP Location: Right Arm)   Pulse 69   Temp 98.1 F (36.7 C) (Oral)   Resp 16   Ht 5\' 4"  (1.626 m)   Wt  145 lb (65.8 kg)   SpO2 96%   BMI 24.89 kg/m   Visual Acuity Right Eye Distance:   Left Eye Distance:   Bilateral Distance:    Right Eye Near:   Left Eye Near:    Bilateral Near:     Physical Exam Vitals and nursing note reviewed.  Constitutional:      General: She is not in acute distress.    Appearance: Normal appearance.  HENT:     Head: Normocephalic.     Right Ear: Tympanic membrane, ear canal and external ear normal.     Left Ear: Tympanic membrane, ear canal and external ear normal.     Nose: Nose normal.     Mouth/Throat:     Lips: Pink.     Mouth: Mucous membranes are moist.  Eyes:     Extraocular Movements: Extraocular movements intact.     Conjunctiva/sclera: Conjunctivae normal.     Pupils: Pupils are equal, round, and reactive to light.  Cardiovascular:     Rate and Rhythm: Normal rate and regular rhythm.     Pulses: Normal pulses.     Heart sounds: Normal heart sounds.  Pulmonary:     Effort: Pulmonary effort is normal. No respiratory distress.     Breath sounds: Normal breath sounds. No stridor. No wheezing, rhonchi or rales.  Abdominal:     General: Bowel sounds are normal.     Palpations: Abdomen is soft.  Musculoskeletal:     Cervical back: Normal range of motion.  Lymphadenopathy:     Cervical: No cervical adenopathy.  Skin:    General: Skin is warm and dry.  Neurological:     General: No focal deficit present.     Mental Status: She is alert and oriented to person, place, and time.  Psychiatric:        Mood and Affect: Mood normal.        Behavior: Behavior normal.      UC Treatments / Results  Labs (all labs ordered are listed, but only abnormal results are displayed) Labs Reviewed - No data to display  EKG: Normal sinus rhythm with left bundle branch block, no STEMI   Radiology No results found.  Procedures Procedures (including critical care time)  Medications Ordered in UC Medications - No data to display  Initial  Impression / Assessment and Plan / UC Course  I have reviewed the triage vital signs and the nursing notes.  Pertinent labs & imaging results that were available during my care of the patient were reviewed by me and considered in my medical decision making (see chart for details).  Patient presents for complaints of "not feeling herself" to include weakness, dizziness, decreased appetite, and nausea.  Patient is in no acute distress, but does appear as if she does not feel well.  Her vital signs are mostly stable, although she is hypertensive.  EKG shows left bundle branch block, no STEMI, which is consistent with her medical history.  Based on the patient's age and current symptoms, recommend that she follow-up in the emergency department for further evaluation and work-up.  Discussed same with patient, and she is in agreement with this recommendation.  Patient's vital signs are stable, and is able to travel via private vehicle with her friend.  Discharged to the emergency department.  1.  Weakness 2. Dizziness 3. Decreased appetite 4.  Nausea without vomiting  Go to the emergency department for further evaluation and work-up. Final Clinical Impressions(s) / UC Diagnoses   Final diagnoses:  None   Discharge Instructions   None    ED Prescriptions   None    PDMP not reviewed this encounter.   Abran Cantor, NP 09/03/22 [phone]
